# Patient Record
Sex: Male | Born: 2000 | Race: White | Hispanic: No | Marital: Single | Smoking: Never smoker
Health system: Southern US, Community
[De-identification: ages and names within clinical notes are randomized; demographics above are authoritative.]

## PROBLEM LIST (undated history)

## (undated) DIAGNOSIS — F909 Attention-deficit hyperactivity disorder, unspecified type: Secondary | ICD-10-CM

## (undated) HISTORY — PX: TONSILLECTOMY: SUR1361

## (undated) HISTORY — PX: WISDOM TOOTH EXTRACTION: SHX21

## (undated) HISTORY — PX: INNER EAR SURGERY: SHX679

---
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
## 2019-10-08 ENCOUNTER — Other Ambulatory Visit: Payer: Self-pay

## 2019-10-08 ENCOUNTER — Emergency Department (HOSPITAL_BASED_OUTPATIENT_CLINIC_OR_DEPARTMENT_OTHER)
Admission: EM | Admit: 2019-10-08 | Discharge: 2019-10-09 | Disposition: A | Discharge status: Home / Self Care | Payer: Managed Care, Other (non HMO) | Attending: Emergency Medicine | Admitting: Emergency Medicine

## 2019-10-08 ENCOUNTER — Encounter (HOSPITAL_BASED_OUTPATIENT_CLINIC_OR_DEPARTMENT_OTHER): Payer: Self-pay | Admitting: *Deleted

## 2019-10-08 ENCOUNTER — Emergency Department (HOSPITAL_BASED_OUTPATIENT_CLINIC_OR_DEPARTMENT_OTHER): Payer: Managed Care, Other (non HMO)

## 2019-10-08 DIAGNOSIS — R002 Palpitations: Secondary | ICD-10-CM | POA: Insufficient documentation

## 2019-10-08 DIAGNOSIS — R0602 Shortness of breath: Secondary | ICD-10-CM | POA: Diagnosis not present

## 2019-10-08 DIAGNOSIS — R0789 Other chest pain: Secondary | ICD-10-CM | POA: Diagnosis not present

## 2019-10-08 DIAGNOSIS — R42 Dizziness and giddiness: Secondary | ICD-10-CM | POA: Insufficient documentation

## 2019-10-08 DIAGNOSIS — R079 Chest pain, unspecified: Secondary | ICD-10-CM

## 2019-10-08 HISTORY — DX: Attention-deficit hyperactivity disorder, unspecified type: F90.9

## 2019-10-08 LAB — CBC
HCT: 42 % (ref 39.0–52.0)
Hemoglobin: 14.4 g/dL (ref 13.0–17.0)
MCH: 29.8 pg (ref 26.0–34.0)
MCHC: 34.3 g/dL (ref 30.0–36.0)
MCV: 86.8 fL (ref 80.0–100.0)
Platelets: 272 10*3/uL (ref 150–400)
RBC: 4.84 MIL/uL (ref 4.22–5.81)
RDW: 11.9 % (ref 11.5–15.5)
WBC: 7.3 10*3/uL (ref 4.0–10.5)
nRBC: 0 % (ref 0.0–0.2)

## 2019-10-08 LAB — BASIC METABOLIC PANEL
Anion gap: 7 (ref 5–15)
BUN: 14 mg/dL (ref 6–20)
CO2: 23 mmol/L (ref 22–32)
Calcium: 9.4 mg/dL (ref 8.9–10.3)
Chloride: 108 mmol/L (ref 98–111)
Creatinine, Ser: 0.77 mg/dL (ref 0.61–1.24)
GFR calc Af Amer: >60 mL/min (ref >60)
GFR calc non Af Amer: >60 mL/min (ref >60)
Glucose, Bld: 88 mg/dL (ref 70–99)
Potassium: 3.7 mmol/L (ref 3.5–5.1)
Sodium: 138 mmol/L (ref 135–145)

## 2019-10-08 LAB — MAGNESIUM: Magnesium: 2.1 mg/dL (ref 1.7–2.4)

## 2019-10-08 LAB — TROPONIN I (HIGH SENSITIVITY): Troponin I (High Sensitivity): <2 ng/L (ref <18)

## 2019-10-08 NOTE — ED Triage Notes (Signed)
Throbbing pain in his chest since noon today. States it started while drinking an energy drink.

## 2019-10-08 NOTE — ED Provider Notes (Signed)
[facility] Provider Note   CSN: [id number] Arrival date & time: 10/08/19  2137     History Chief Complaint  Patient presents with  . Chest Pain    Antonio Berry is a 19 y.o. male w/ hx of ADHD who presents to the ED with chest pressure. His symptoms began around 12 PM today, when he began having chest pain and feeling a heaviness in his chest.  He felt something was skipping.  He was also lightheaded.  He tried to work through this for about 4 hours.  The symptoms seem to go away while he was working, but seemed to resume after he finished working.    He has never had this before.  He denies any family history of MI or sudden cardiac death.  He denies any known congenital conditions in his family.    He denies any fevers, chills, cough, congestion.  He was in his usual state of health this week prior to this.  He denies any nausea vomiting or diarrhea.  He denies smoking or any recreational drug use.  He reports he eats a fairly balanced diet, denies any hx of electrolyte derangements.  He drinks 1 energy drink every morning.  No hemoptysis, recent surgery, hormone use, hx or symptoms of DVT or PE.  NKDA PMhx: ADHD Meds: Adderoll  HPI     Past Medical History:  Diagnosis Date  . ADHD     There are no problems to display for this patient.   Past Surgical History:  Procedure Laterality Date  . INNER EAR SURGERY    . TONSILLECTOMY    . WISDOM TOOTH EXTRACTION         No family history on file.  Social History   Tobacco Use  . Smoking status: Never Smoker  . Smokeless tobacco: Never Used  Substance Use Topics  . Alcohol use: Never  . Drug use: Never    Home Medications Prior to Admission medications   Medication Sig Start Date End Date Taking? Authorizing Provider  amphetamine-dextroamphetamine (ADDERALL XR) 20 MG 24 hr capsule Take by mouth. 09/06/19  Yes [provider]    Allergies    Adhesive   [tape]  Review of Systems   Review of Systems  Constitutional: Negative for chills and fever.  Eyes: Negative for photophobia and visual disturbance.  Respiratory: Positive for chest tightness and shortness of breath. Negative for cough.   Cardiovascular: Positive for chest pain and palpitations.  Gastrointestinal: Negative for abdominal pain, nausea and vomiting.  Musculoskeletal: Negative for arthralgias and back pain.  Skin: Negative for color change and rash.  Neurological: Positive for light-headedness. Negative for syncope, speech difficulty, weakness and headaches.  Psychiatric/Behavioral: Negative for agitation and confusion.  All other systems reviewed and are negative.   Physical Exam Updated Vital Signs BP 131/70   Pulse 72   Temp 98.8 F (37.1 C) (Oral)   Resp 19   Ht 5\' 7"  (1.702 m)   Wt 71.2 kg   SpO2 100%   BMI 24.59 kg/m   Physical Exam Vitals and nursing note reviewed.  Constitutional:      Appearance: He is well-developed.  HENT:     Head: Normocephalic and atraumatic.  Eyes:     Conjunctiva/sclera: Conjunctivae normal.  Cardiovascular:     Rate and Rhythm: Normal rate and regular rhythm.     Heart sounds: No murmur.  Pulmonary:     Effort: Pulmonary effort is normal. No respiratory distress.  Breath sounds: Normal breath sounds.  Abdominal:     Palpations: Abdomen is soft.     Tenderness: There is no abdominal tenderness.  Musculoskeletal:     Cervical back: Neck supple.  Skin:    General: Skin is warm and dry.  Neurological:     General: No focal deficit present.     Mental Status: He is alert and oriented to person, place, and time.  Psychiatric:        Mood and Affect: Mood normal.        Behavior: Behavior normal.     ED Results / Procedures / Treatments   Labs (all labs ordered are listed, but only abnormal results are displayed) Labs Reviewed  BASIC METABOLIC PANEL  MAGNESIUM  CBC  TROPONIN I (HIGH SENSITIVITY)     EKG EKG Interpretation  Date/Time:  Friday October 08 2019 21:45:30 EST Ventricular Rate:  92 PR Interval:  122 QRS Duration: 100 QT Interval:  350 QTC Calculation: 432 R Axis:   96 Text Interpretation: Normal sinus rhythm U waves noted No STEMI Confirmed by Alvester Chou [phone]) on 10/09/2019 11:59:28 AM   Radiology DG Chest Portable 1 View  Result Date: 10/08/2019 CLINICAL DATA:  Chest pain. EXAM: PORTABLE CHEST 1 VIEW COMPARISON:  March 07, 2017 FINDINGS: The heart size and mediastinal contours are within normal limits. Both lungs are clear. The visualized skeletal structures are unremarkable. A foreign body again projects over the patient's left chest wall. IMPRESSION: No active disease. Electronically Signed   By: Katherine Mantle M.D.   On: 10/08/2019 23:19    Procedures Procedures (including critical care time)  Medications Ordered in ED Medications - No data to display  ED Course  I have reviewed the triage vital signs and the nursing notes.  Pertinent labs & imaging results that were available during my care of the patient were reviewed by me and considered in my medical decision making (see chart for details).  Well appearing, previously healthy 19 yo male presenting to the ED with an episode of chest pressure, palpitations, lightheadedness that began around noon today.  He has a benign physical exam.  Ecg demonstrates U waves, otherwise unremarkable.  No sign of heart block or prolonged QT.  No brugada pattern in V1-V2, and no family hx of sudden cardiac death, or arrhythmias.  His electrolyte levels were wnl here, including K, Mg.  Unclear if he may have had an SVT episode.  PERC negative, lower suspicion for PE at this time.  Clinical Course as of Oct 08 1200  Sat Oct 09, 2019  0019 Patient informed of results, will dishcarge   [MT]    Clinical Course User Index [MT] Tonyetta Berko, Kermit Balo, MD    Final Clinical Impression(s) / ED Diagnoses Final  diagnoses:  Chest pain, unspecified type    Rx / DC Orders ED Discharge Orders    None       Terald Sleeper, MD 10/09/19 1242

## 2019-10-08 NOTE — ED Notes (Signed)
ED Provider at bedside. 

## 2019-10-09 ENCOUNTER — Other Ambulatory Visit: Payer: Self-pay | Admitting: Physician Assistant

## 2019-10-09 DIAGNOSIS — R079 Chest pain, unspecified: Secondary | ICD-10-CM

## 2019-10-09 DIAGNOSIS — R9431 Abnormal electrocardiogram [ECG] [EKG]: Secondary | ICD-10-CM

## 2019-10-09 NOTE — ED Provider Notes (Signed)
I reached out to the patient this morning to see how he's feeling.  He feels significantly better today.  I told him that after reviewing his ECG again today, I am concerned there may be a HCM type pattern on the ECG.  I gave him the phone number for our [facility], and I also contacted Dr. Duke Salvia who is on today for cardiology, and asked if their office could reach out to [facility] to arrange for an expedited outpatient echocardiogram.  She told me they would do so.   Terald Sleeper, MD 10/09/19 [phone]

## 2019-10-19 ENCOUNTER — Ambulatory Visit (HOSPITAL_COMMUNITY): Payer: Managed Care, Other (non HMO) | Attending: Internal Medicine

## 2019-10-19 ENCOUNTER — Encounter: Payer: Self-pay | Admitting: Cardiology

## 2019-10-19 ENCOUNTER — Other Ambulatory Visit: Payer: Self-pay

## 2019-10-19 DIAGNOSIS — R079 Chest pain, unspecified: Secondary | ICD-10-CM

## 2019-10-19 DIAGNOSIS — R9431 Abnormal electrocardiogram [ECG] [EKG]: Secondary | ICD-10-CM | POA: Diagnosis not present

## 2019-10-20 ENCOUNTER — Telehealth: Payer: Self-pay | Admitting: *Deleted

## 2019-10-20 NOTE — Telephone Encounter (Signed)
Returned call to pt / pt's dad.  They have both been made aware of pt's echo results and all, if any, questions have been answered.

## 2019-10-20 NOTE — Telephone Encounter (Signed)
Patient's dad calling in for echo results.

## 2020-04-26 ENCOUNTER — Emergency Department (HOSPITAL_BASED_OUTPATIENT_CLINIC_OR_DEPARTMENT_OTHER): Payer: Managed Care, Other (non HMO)

## 2020-04-26 ENCOUNTER — Other Ambulatory Visit: Payer: Self-pay

## 2020-04-26 ENCOUNTER — Emergency Department (HOSPITAL_BASED_OUTPATIENT_CLINIC_OR_DEPARTMENT_OTHER)
Admission: EM | Admit: 2020-04-26 | Discharge: 2020-04-26 | Disposition: A | Discharge status: Home / Self Care | Payer: Managed Care, Other (non HMO) | Attending: Emergency Medicine | Admitting: Emergency Medicine

## 2020-04-26 ENCOUNTER — Encounter (HOSPITAL_BASED_OUTPATIENT_CLINIC_OR_DEPARTMENT_OTHER): Payer: Self-pay | Admitting: *Deleted

## 2020-04-26 DIAGNOSIS — S0093XA Contusion of unspecified part of head, initial encounter: Secondary | ICD-10-CM

## 2020-04-26 DIAGNOSIS — S060X0A Concussion without loss of consciousness, initial encounter: Secondary | ICD-10-CM | POA: Diagnosis not present

## 2020-04-26 DIAGNOSIS — Y929 Unspecified place or not applicable: Secondary | ICD-10-CM | POA: Diagnosis not present

## 2020-04-26 DIAGNOSIS — S0990XA Unspecified injury of head, initial encounter: Secondary | ICD-10-CM | POA: Diagnosis present

## 2020-04-26 DIAGNOSIS — W228XXA Striking against or struck by other objects, initial encounter: Secondary | ICD-10-CM | POA: Insufficient documentation

## 2020-04-26 DIAGNOSIS — Y999 Unspecified external cause status: Secondary | ICD-10-CM | POA: Diagnosis not present

## 2020-04-26 DIAGNOSIS — S0083XA Contusion of other part of head, initial encounter: Secondary | ICD-10-CM | POA: Diagnosis not present

## 2020-04-26 DIAGNOSIS — Y939 Activity, unspecified: Secondary | ICD-10-CM | POA: Diagnosis not present
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
## 2021-03-24 IMAGING — CT CT ORBITS W/O CM
3 series · 14 of 47 positions shown, 16 images · non-contrast
Comparison: Radiographs of the orbits 08/31/2018. Report from CT of
the orbits/sella 05/16/2003 (images unavailable).

CLINICAL DATA: Facial trauma. Additional history provided: Patient
reports head injury 3 hours ago, denies loss of consciousness, large
hematoma to left forehead and over the left eye.

EXAM:
CT ORBITS WITHOUT CONTRAST
TECHNIQUE: Multidetector CT images were obtained using the standard protocol
without intravenous contrast.

[Series 3: orbits 2.0 h30s st · axial · 0.33mm/px · z∈[+1002,+1102]mm · 8 of 58 slices shown, 10 images]
[im 4/58  brain]
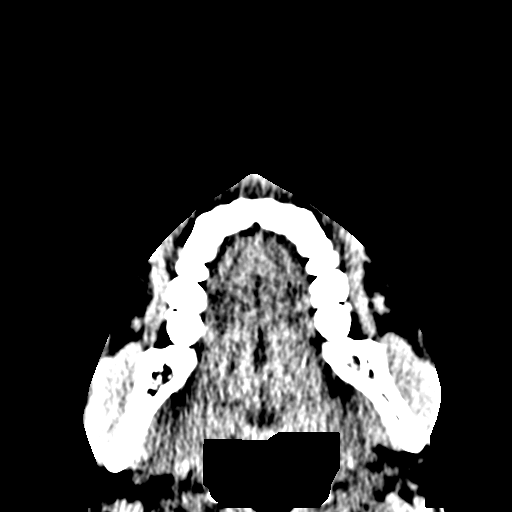
[im 4/58  bone]
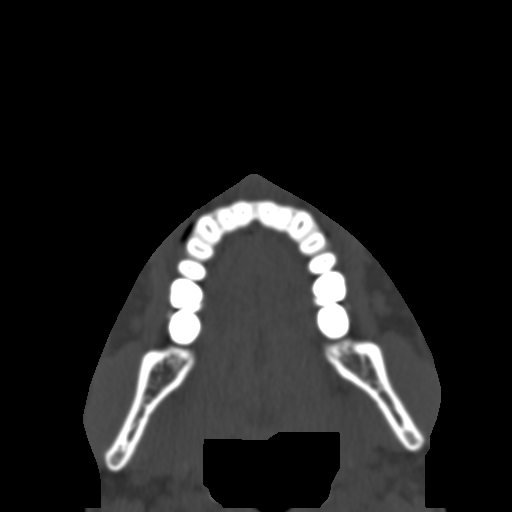
[im 12/58  bone]
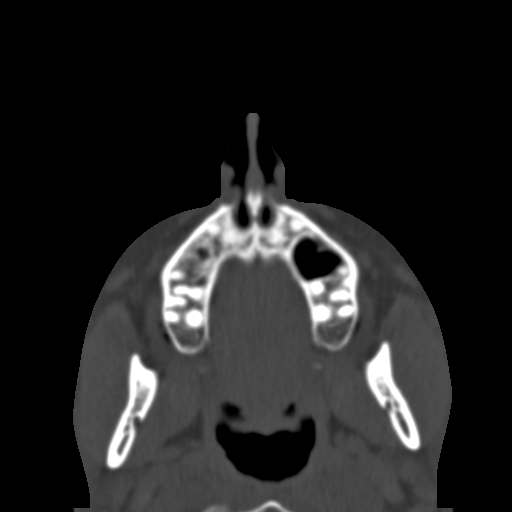
[im 18/58  bone]
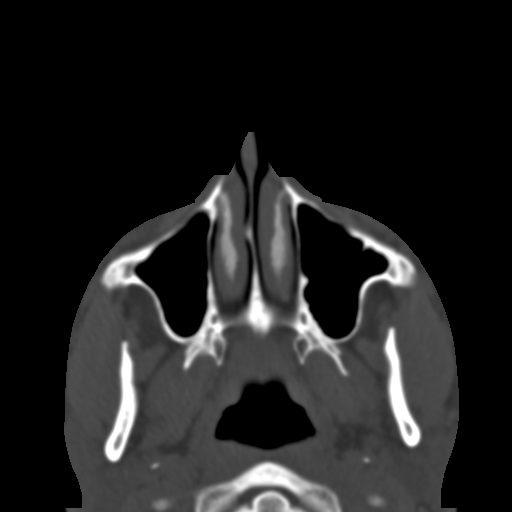
[im 26/58  bone]
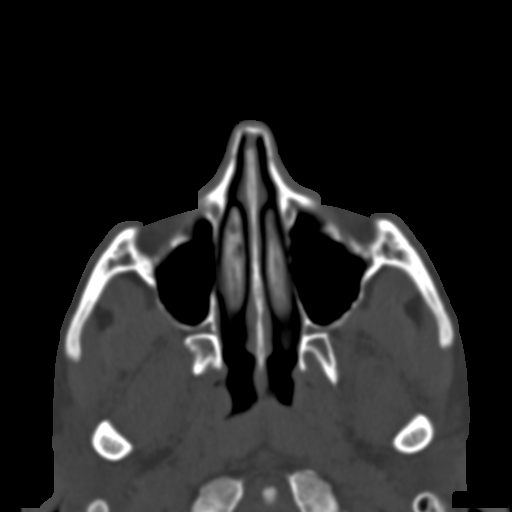
[im 32/58  brain]
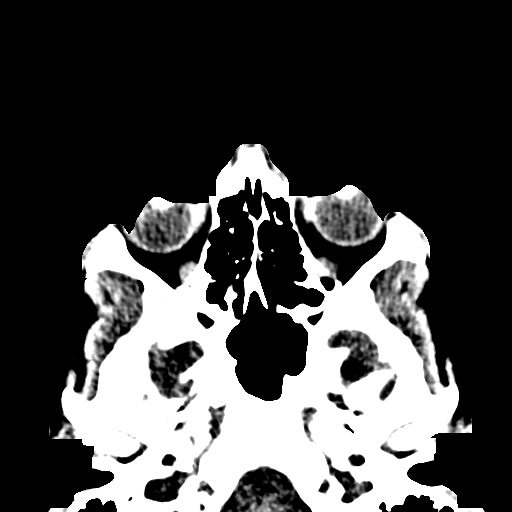
[im 32/58  bone]
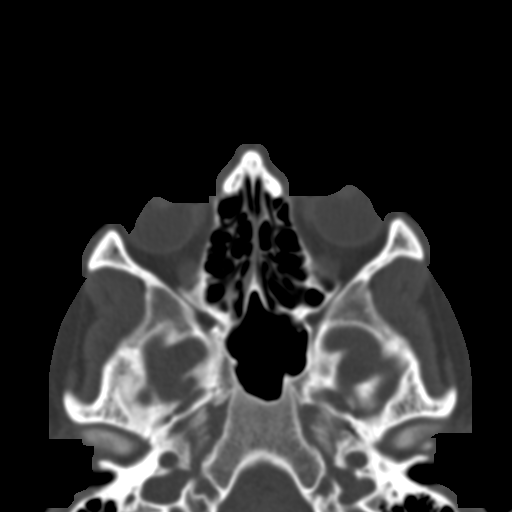
[im 40/58  bone]
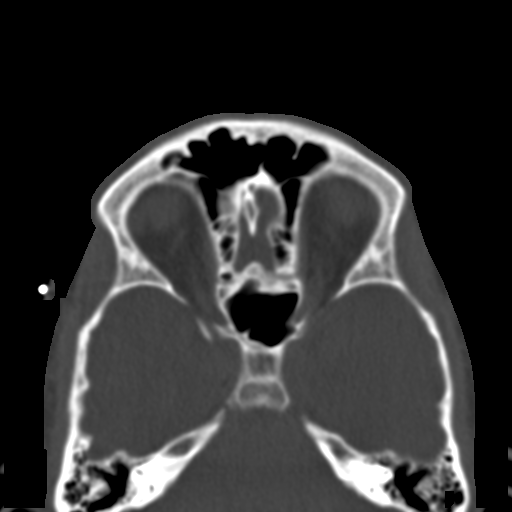
[im 46/58  bone]
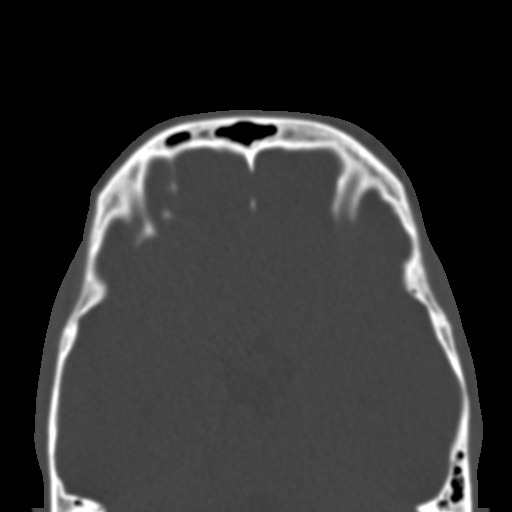
[im 54/58  bone]
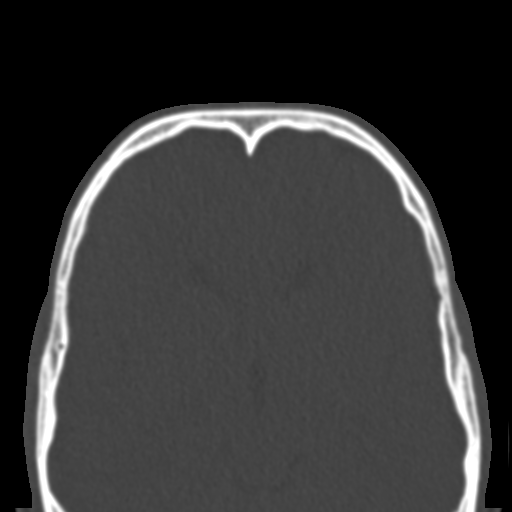

[Series 8: orbits 2.0 coronal · coronal · 0.24mm/px · 3 of 63 slices shown]
[im 21/63  bone]
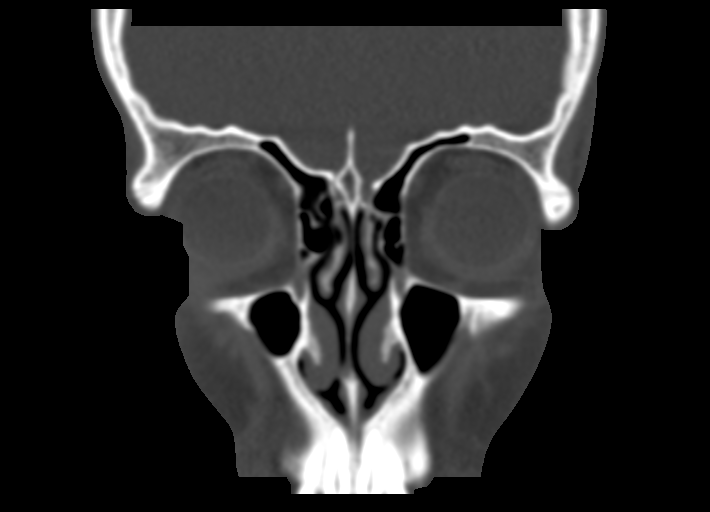
[im 28/63  bone]
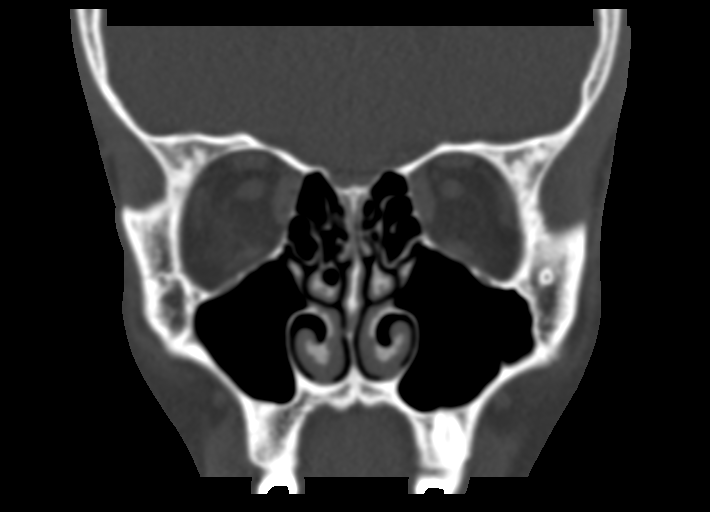
[im 35/63  bone]
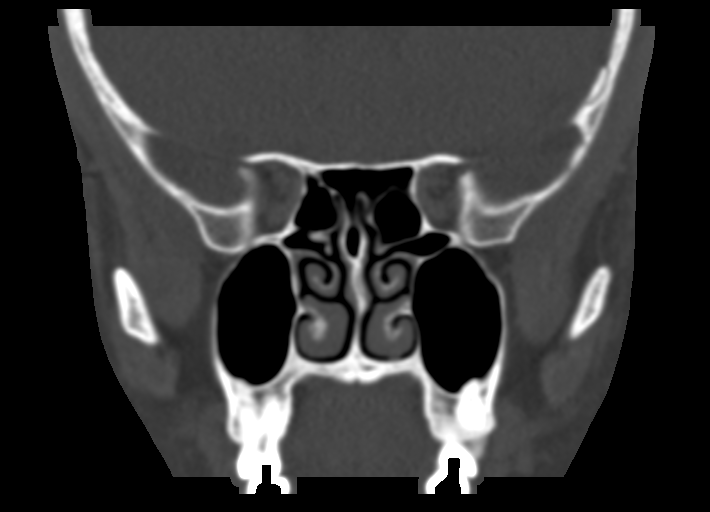

[Series 9: orbits 2.0 sagittal · sagittal · 0.23mm/px · 3 of 87 slices shown]
[im 29/87  bone]
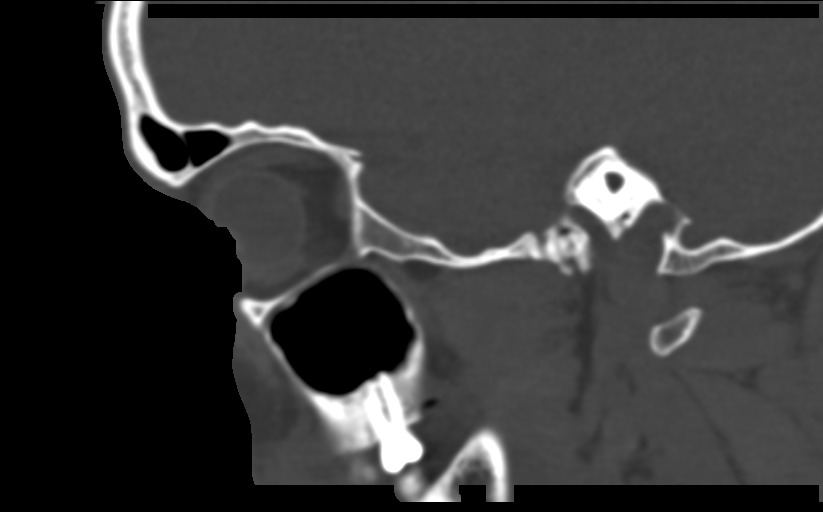
[im 44/87  bone]
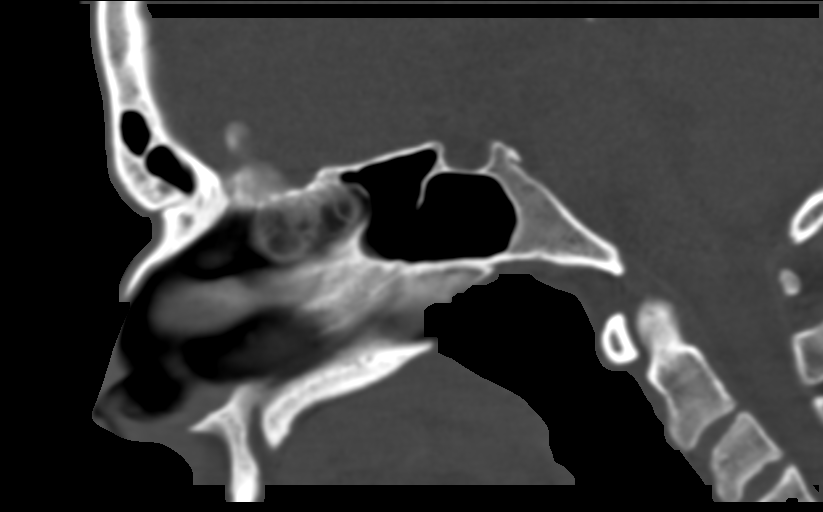
[im 58/87  bone]
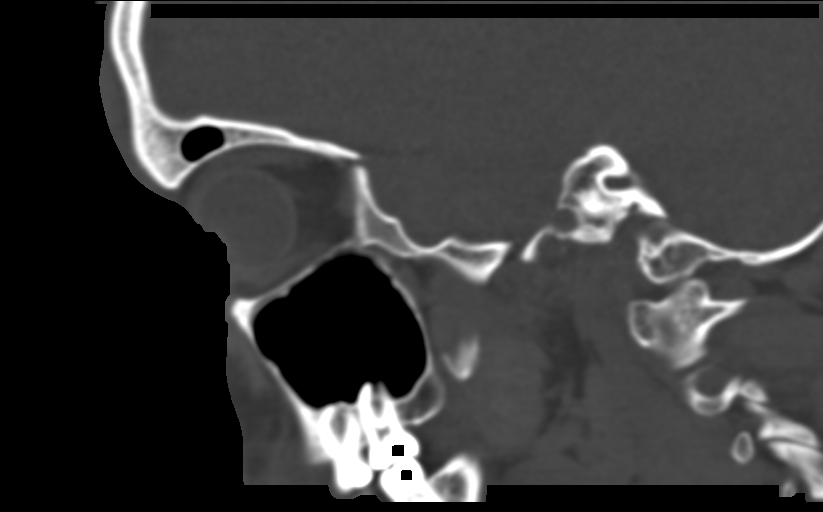

[14 of 47 positions shown; findings below may reference images not displayed]

FINDINGS: Orbits: No acute orbital fracture is demonstrated. The normal in
contour. The extraocular muscles and optic nerve sheath complexes
are symmetric and unremarkable.

Visualized sinuses: Minimal ethmoid sinus mucosal thickening. No
visible mastoid effusion.

Soft tissues: Left forehead/periorbital soft tissue hematoma.

Limited intracranial: No acute abnormality identified.
IMPRESSION: No acute orbital fracture is demonstrated.

Left forehead/periorbital soft tissue hematoma.

Minimal ethmoid sinus mucosal thickening.
